# Patient Record
Sex: Male | Born: 1985 | Race: White | Hispanic: No | Marital: Single | Smoking: Current every day smoker
Health system: Southern US, Community
[De-identification: ages and names within clinical notes are randomized; demographics above are authoritative.]

---

## 2021-02-06 ENCOUNTER — Emergency Department (HOSPITAL_COMMUNITY)
Admission: EM | Admit: 2021-02-06 | Discharge: 2021-02-06 | Discharge status: Against Medical Advice | Payer: Self-pay | Attending: Emergency Medicine | Admitting: Emergency Medicine

## 2021-02-06 DIAGNOSIS — R569 Unspecified convulsions: Secondary | ICD-10-CM | POA: Insufficient documentation

## 2021-02-06 NOTE — ED Triage Notes (Signed)
Pt arrives to ED BIB [facility] due to a Drug Overdose. Per EMS pt was seen walking around at [facility] when pt fell and began having seizure like activity. Upon EMS arrival pt was Apneic and was posturing. 5mg  versed IM was administered by EMS with no improvement. Per EMS drug paraphernalia was seen near pt and pupils were noted to be small, 2mg  IN Narcan was administered by EMS and pt responded. No known Hx of Seizures. Pt A/O x4 and is requesting to leave AMA. , PA-C is assessing pt and is going over the Risks of leaving AMA. Pt accepts the possible risks and agrees to signing AMA form.

## 2021-02-06 NOTE — Discharge Instructions (Addendum)
After any first-time seizure, we recommend that you do not drive, bathe or swim by yourself for 6 months.  Follow-up with neurology.  If at any time, need evaluation with me, return to emergency department.

## 2021-02-06 NOTE — ED Provider Notes (Signed)
Patient is a 35 year old male, he has no significant prior medical history in the system, he presents after having abnormal mental status at a [employer], there was possible seizure activity.  When the patient was seen by paramedics he was still having some posturing and did not seem to be breathing that well, when they got him to the ambulance he was given Narcan and had complete resolution of his symptoms and back to normal mental status.  He is trying to refuse treatment on arrival but was willing to come and let the providers evaluate him.  On my exam the patient is alert awake sitting upright, vital signs are unremarkable, he does not appear to be in any sign of distress, no signs of deformity  Shared evaluation with PA Layden.  Medical screening examination/treatment/procedure(s) were conducted as a shared visit with non-physician practitioner(s) and myself.  I personally evaluated the patient during the encounter.  Clinical Impression:   Final diagnoses:  Seizure [facility])         Eber Hong, MD 02/10/21 1112

## 2021-02-06 NOTE — ED Provider Notes (Signed)
[facility] EMERGENCY DEPARTMENT Provider Note   CSN: [id number] Arrival date & time: 02/06/21  1555     History Chief Complaint  Patient presents with  . Drug Overdose    Omar Calhoun is a 35 y.o. male brought in by EMS for seizure activity.  Patient reportedly walked into [place] and had a seizure.  This was witnessed.  Witnesses report grand mal seizure.  He was postictal afterwards.  EMS arrived and gave him Versed.  Additionally, they gave him Narcan which improved his mental status.  Patient does endorse using marijuana today.  Denies any other drug use.  Denies any prior history of seizure.  He states that he does not drink alcohol frequently and has never had seizures from withdrawal of alcohol.  Patient denies any neck pain, back pain, chest pain, difficulty breathing, abdominal pain, numbness/weakness of his arms or legs.  He denies any SI, HI.  The history is provided by the patient.       No past medical history on file.  There are no problems to display for this patient.    The histories are not reviewed yet. Please review them in the "History" navigator section and refresh this SmartLink.     No family history on file.     Home Medications Prior to Admission medications   Not on File    Allergies    Patient has no allergy information on record.  Review of Systems   Review of Systems  Constitutional: Negative for fever.  Eyes: Negative for visual disturbance.  Respiratory: Negative for shortness of breath.   Cardiovascular: Negative for chest pain.  Gastrointestinal: Negative for abdominal pain, nausea and vomiting.  Neurological: Positive for seizures. Negative for weakness and numbness.  All other systems reviewed and are negative.   Physical Exam Updated Vital Signs BP 110/74 (BP Location: Right Arm)   Pulse (!) 110   Temp 98 F (36.7 C) (Oral)   Resp 15   SpO2 94%   Physical Exam Vitals and nursing note reviewed.   Constitutional:      Appearance: Normal appearance. He is well-developed.  HENT:     Head: Normocephalic and atraumatic.  Eyes:     General: Lids are normal.     Conjunctiva/sclera: Conjunctivae normal.     Pupils: Pupils are equal, round, and reactive to light.     Comments: Pupils 5 mm and reactive.  EOMs intact light difficulty.  Neck:     Comments: Full flexion/extension and lateral movement of neck fully intact. No bony midline tenderness. No deformities or crepitus.  Cardiovascular:     Rate and Rhythm: Normal rate and regular rhythm.     Pulses: Normal pulses.     Heart sounds: Normal heart sounds. No murmur heard. No friction rub. No gallop.   Pulmonary:     Effort: Pulmonary effort is normal.     Breath sounds: Normal breath sounds.     Comments: Lungs clear to auscultation bilaterally.  Symmetric chest rise.  No wheezing, rales, rhonchi. Abdominal:     Palpations: Abdomen is soft. Abdomen is not rigid.     Tenderness: There is no abdominal tenderness. There is no guarding.     Comments: Abdomen is soft, non-distended, non-tender. No rigidity, No guarding. No peritoneal signs.  Musculoskeletal:        General: Normal range of motion.     Cervical back: Full passive range of motion without pain.     Comments: No  midline T or L-spine.  No deformity or crepitus noted.  Skin:    General: Skin is warm and dry.     Capillary Refill: Capillary refill takes less than 2 seconds.  Neurological:     Mental Status: He is alert and oriented to person, place, and time.     Comments: Cranial nerves III-XII intact Follows commands, Moves all extremities  5/5 strength to BUE and BLE  Sensation intact throughout all major nerve distributions No gait abnormalities  No slurred speech. No facial droop.  Alert and oriented x 3.   Psychiatric:        Speech: Speech normal.     Comments: Speech is coherent and clear. SI, HI.      ED Results / Procedures / Treatments   Labs (all  labs ordered are listed, but only abnormal results are displayed) Labs Reviewed - No data to display  EKG None  Radiology No results found.  Procedures Procedures   Medications Ordered in ED Medications - No data to display  ED Course  I have reviewed the triage vital signs and the nursing notes.  Pertinent labs & imaging results that were available during my care of the patient were reviewed by me and considered in my medical decision making (see chart for details).    MDM Rules/Calculators/A&P                          35 year old male brought in by EMS for evaluation of seizure.  Patient apparently walked into [employer] and started having a witnessed grand mal seizure.  EMS were called.  He was given Versed.  He was also given 2 mg of Narcan which improved his mental status.  Patient does endorse using marijuana today.  He denies any other drug, alcohol use.  He has never had a seizure before.  On initial arrival, he is nontoxic appearing. Vitals sable.Marland Kitchen  He is alert and oriented x3 and able to answer all my questions.  He denies any SI, HI.  No neuro deficits noted on exam.  He is able to walk without any signs of gait ataxia.  I discussed with patient that there could be a number of different reasons why he had a seizure, including but not limited to infection, intracranial abnormality, electrolyte imbalance, drug use, seizure disorder.  I discussed with patient regarding my plan for further work-up, including lab work, imaging here in the emergency department.  After discussing with patient, he stated that he did not want to stay for any work-up and that he would want to leave.  I had an extensive discussion with him regarding the risk versus benefits of leaving and discussed with him that if he left, he would be doing so AMA.  This conversation was witnessed by Eulis Foster, Charity fundraiser.  Patient expressed understanding and stated that he wished to leave.  Patient appears clinically sober.  He is able  to answer all my questions and shows no signs of slurred speech.  He is alert and oriented x3.  He exhibits full medical decision-making capacity, denies any SI, HI.  Do not feel that he requires IVC.  I again offered patient the opportunity for work-up here in the emergency department to determine the etiology of his seizure.  Patient states that he did not want to stay.  Patient able ambulate without any signs of gait ataxia.  Patient understands that he will be discharged AGAINST MEDICAL ADVICE.  I did  discuss with him regarding seizure precautions, including no driving, bathing, swimming alone for 6 months.  Discussed with patient that anytime, if he feels he needs evaluation, we can reevaluate him in the emergency department.  Portions of this note were generated with Scientist, clinical (histocompatibility and immunogenetics). Dictation errors may occur despite best attempts at proofreading.  Final Clinical Impression(s) / ED Diagnoses Final diagnoses:  Seizure [facility])    Rx / DC Orders ED Discharge Orders    None       Rosana Hoes 02/06/21 1912    Eber Hong, MD 02/10/21 1112

## 2021-02-17 ENCOUNTER — Emergency Department (HOSPITAL_COMMUNITY)
Admission: EM | Admit: 2021-02-17 | Discharge: 2021-02-17 | Disposition: A | Discharge status: Home / Self Care | Payer: Self-pay | Attending: Emergency Medicine | Admitting: Emergency Medicine

## 2021-02-17 ENCOUNTER — Encounter (HOSPITAL_COMMUNITY): Payer: Self-pay

## 2021-02-17 ENCOUNTER — Other Ambulatory Visit: Payer: Self-pay

## 2021-02-17 ENCOUNTER — Emergency Department (HOSPITAL_COMMUNITY): Payer: Self-pay

## 2021-02-17 DIAGNOSIS — F17200 Nicotine dependence, unspecified, uncomplicated: Secondary | ICD-10-CM | POA: Insufficient documentation

## 2021-02-17 DIAGNOSIS — X58XXXA Exposure to other specified factors, initial encounter: Secondary | ICD-10-CM | POA: Insufficient documentation

## 2021-02-17 DIAGNOSIS — R000 Tachycardia, unspecified: Secondary | ICD-10-CM | POA: Insufficient documentation

## 2021-02-17 DIAGNOSIS — T50901A Poisoning by unspecified drugs, medicaments and biological substances, accidental (unintentional), initial encounter: Secondary | ICD-10-CM | POA: Insufficient documentation

## 2021-02-17 LAB — CBC WITH DIFFERENTIAL/PLATELET
Abs Immature Granulocytes: 0.04 10*3/uL (ref 0.00–0.07)
Basophils Absolute: 0.1 10*3/uL (ref 0.0–0.1)
Basophils Relative: 1 %
Eosinophils Absolute: 0 10*3/uL (ref 0.0–0.5)
Eosinophils Relative: 0 %
HCT: 48.6 % (ref 39.0–52.0)
Hemoglobin: 15.7 g/dL (ref 13.0–17.0)
Immature Granulocytes: 0 %
Lymphocytes Relative: 24 %
Lymphs Abs: 2.2 10*3/uL (ref 0.7–4.0)
MCH: 27.5 pg (ref 26.0–34.0)
MCHC: 32.3 g/dL (ref 30.0–36.0)
MCV: 85.1 fL (ref 80.0–100.0)
Monocytes Absolute: 0.4 10*3/uL (ref 0.1–1.0)
Monocytes Relative: 5 %
Neutro Abs: 6.3 10*3/uL (ref 1.7–7.7)
Neutrophils Relative %: 70 %
Platelets: 294 10*3/uL (ref 150–400)
RBC: 5.71 MIL/uL (ref 4.22–5.81)
RDW: 13.7 % (ref 11.5–15.5)
WBC: 9 10*3/uL (ref 4.0–10.5)
nRBC: 0 % (ref 0.0–0.2)

## 2021-02-17 LAB — COMPREHENSIVE METABOLIC PANEL
ALT: 18 U/L (ref 0–44)
AST: 18 U/L (ref 15–41)
Albumin: 4.4 g/dL (ref 3.5–5.0)
Alkaline Phosphatase: 70 U/L (ref 38–126)
Anion gap: 9 (ref 5–15)
BUN: 16 mg/dL (ref 6–20)
CO2: 22 mmol/L (ref 22–32)
Calcium: 8.7 mg/dL — ABNORMAL LOW (ref 8.9–10.3)
Chloride: 109 mmol/L (ref 98–111)
Creatinine, Ser: 1.3 mg/dL — ABNORMAL HIGH (ref 0.61–1.24)
GFR, Estimated: >60 mL/min (ref >60)
Glucose, Bld: 110 mg/dL — ABNORMAL HIGH (ref 70–99)
Potassium: 4.7 mmol/L (ref 3.5–5.1)
Sodium: 140 mmol/L (ref 135–145)
Total Bilirubin: 0.8 mg/dL (ref 0.3–1.2)
Total Protein: 7.6 g/dL (ref 6.5–8.1)

## 2021-02-17 LAB — CBG MONITORING, ED: Glucose-Capillary: 117 mg/dL — ABNORMAL HIGH (ref 70–99)

## 2021-02-17 MED ORDER — NALOXONE HCL 4 MG/0.1ML NA LIQD: NASAL | 0 refills | Status: DC

## 2021-02-17 MED ORDER — SODIUM CHLORIDE 0.9 % IV BOLUS
1000.0000 mL | Freq: Once | INTRAVENOUS | Status: AC
  Administered 2021-02-17: 1000 mL via INTRAVENOUS
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
## 2021-03-09 ENCOUNTER — Other Ambulatory Visit: Payer: Self-pay

## 2021-03-09 ENCOUNTER — Emergency Department (HOSPITAL_COMMUNITY)
Admission: EM | Admit: 2021-03-09 | Discharge: 2021-03-09 | Disposition: A | Discharge status: Home / Self Care | Payer: Self-pay | Attending: Emergency Medicine | Admitting: Emergency Medicine

## 2021-03-09 ENCOUNTER — Encounter (HOSPITAL_COMMUNITY): Payer: Self-pay

## 2021-03-09 DIAGNOSIS — F17210 Nicotine dependence, cigarettes, uncomplicated: Secondary | ICD-10-CM | POA: Insufficient documentation

## 2021-03-09 DIAGNOSIS — F1910 Other psychoactive substance abuse, uncomplicated: Secondary | ICD-10-CM | POA: Insufficient documentation

## 2021-03-09 MED ORDER — NALOXONE HCL 4 MG/0.1ML NA LIQD: NASAL | 0 refills | Status: AC
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
## 2021-03-09 NOTE — ED Notes (Signed)
Pt demanding directions from location of EMS pickup. Unable to provide directions.
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
## 2021-10-20 ENCOUNTER — Other Ambulatory Visit (HOSPITAL_BASED_OUTPATIENT_CLINIC_OR_DEPARTMENT_OTHER): Payer: Self-pay

## 2021-10-20 MED ORDER — BUPRENORPHINE HCL-NALOXONE HCL 2-0.5 MG SL FILM
ORAL_FILM | SUBLINGUAL | 0 refills | Status: AC
  Filled 2021-10-20: qty 52, 7d supply, fill #0
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
